# Patient Record
Sex: Male | Born: 1965 | Race: Black or African American | Hispanic: No | Marital: Single | Smoking: Never smoker
Health system: Southern US, Community
[De-identification: ages and names within clinical notes are randomized; demographics above are authoritative.]

## PROBLEM LIST (undated history)

## (undated) DIAGNOSIS — J329 Chronic sinusitis, unspecified: Secondary | ICD-10-CM

## (undated) DIAGNOSIS — T148XXA Other injury of unspecified body region, initial encounter: Secondary | ICD-10-CM

---

## 1997-07-07 ENCOUNTER — Emergency Department (HOSPITAL_COMMUNITY): Admission: EM | Admit: 1997-07-07 | Discharge: 1997-07-07 | Payer: Self-pay | Admitting: Emergency Medicine

## 1998-03-13 ENCOUNTER — Emergency Department (HOSPITAL_COMMUNITY): Admission: EM | Admit: 1998-03-13 | Discharge: 1998-03-13 | Payer: Self-pay | Admitting: Emergency Medicine

## 1998-05-17 ENCOUNTER — Emergency Department (HOSPITAL_COMMUNITY): Admission: EM | Admit: 1998-05-17 | Discharge: 1998-05-17 | Payer: Self-pay | Admitting: *Deleted

## 1998-09-04 ENCOUNTER — Emergency Department (HOSPITAL_COMMUNITY): Admission: EM | Admit: 1998-09-04 | Discharge: 1998-09-04 | Payer: Self-pay | Admitting: Emergency Medicine

## 1998-12-10 ENCOUNTER — Emergency Department (HOSPITAL_COMMUNITY): Admission: EM | Admit: 1998-12-10 | Discharge: 1998-12-10 | Payer: Self-pay | Admitting: Emergency Medicine

## 1999-06-12 ENCOUNTER — Encounter: Payer: Self-pay | Admitting: Emergency Medicine

## 1999-06-12 ENCOUNTER — Emergency Department (HOSPITAL_COMMUNITY): Admission: EM | Admit: 1999-06-12 | Discharge: 1999-06-12 | Payer: Self-pay | Admitting: Emergency Medicine

## 1999-07-03 ENCOUNTER — Encounter: Admission: RE | Admit: 1999-07-03 | Discharge: 1999-08-13 | Payer: Self-pay | Admitting: *Deleted

## 2000-03-20 ENCOUNTER — Encounter: Payer: Self-pay | Admitting: Emergency Medicine

## 2000-03-20 ENCOUNTER — Emergency Department (HOSPITAL_COMMUNITY): Admission: EM | Admit: 2000-03-20 | Discharge: 2000-03-20 | Payer: Self-pay | Admitting: Emergency Medicine

## 2000-12-17 ENCOUNTER — Encounter: Payer: Self-pay | Admitting: Emergency Medicine

## 2000-12-17 ENCOUNTER — Emergency Department (HOSPITAL_COMMUNITY): Admission: EM | Admit: 2000-12-17 | Discharge: 2000-12-17 | Payer: Self-pay | Admitting: Emergency Medicine

## 2001-03-08 ENCOUNTER — Emergency Department (HOSPITAL_COMMUNITY): Admission: EM | Admit: 2001-03-08 | Discharge: 2001-03-08 | Payer: Self-pay

## 2001-08-20 ENCOUNTER — Emergency Department (HOSPITAL_COMMUNITY): Admission: EM | Admit: 2001-08-20 | Discharge: 2001-08-20 | Payer: Self-pay | Admitting: Emergency Medicine

## 2005-02-01 ENCOUNTER — Emergency Department (HOSPITAL_COMMUNITY): Admission: EM | Admit: 2005-02-01 | Discharge: 2005-02-01 | Payer: Self-pay | Admitting: Emergency Medicine

## 2005-06-26 ENCOUNTER — Emergency Department (HOSPITAL_COMMUNITY): Admission: EM | Admit: 2005-06-26 | Discharge: 2005-06-26 | Payer: Self-pay | Admitting: Emergency Medicine

## 2005-06-30 ENCOUNTER — Emergency Department (HOSPITAL_COMMUNITY): Admission: EM | Admit: 2005-06-30 | Discharge: 2005-06-30 | Payer: Self-pay | Admitting: Emergency Medicine

## 2005-07-02 ENCOUNTER — Emergency Department (HOSPITAL_COMMUNITY): Admission: EM | Admit: 2005-07-02 | Discharge: 2005-07-02 | Payer: Self-pay | Admitting: Emergency Medicine

## 2005-07-05 ENCOUNTER — Ambulatory Visit: Payer: Self-pay | Admitting: *Deleted

## 2005-07-05 ENCOUNTER — Ambulatory Visit: Payer: Self-pay | Admitting: Family Medicine

## 2006-04-16 ENCOUNTER — Emergency Department (HOSPITAL_COMMUNITY): Admission: EM | Admit: 2006-04-16 | Discharge: 2006-04-16 | Payer: Self-pay | Admitting: Emergency Medicine

## 2008-05-14 ENCOUNTER — Inpatient Hospital Stay (HOSPITAL_COMMUNITY): Admission: AC | Admit: 2008-05-14 | Discharge: 2008-05-18 | Payer: Self-pay

## 2010-05-09 LAB — CBC
HCT: 40 % (ref 39.0–52.0)
Hemoglobin: 13 g/dL (ref 13.0–17.0)
MCHC: 32.6 g/dL (ref 30.0–36.0)
MCHC: 32.7 g/dL (ref 30.0–36.0)
MCHC: 33.3 g/dL (ref 30.0–36.0)
MCV: 82.9 fL (ref 78.0–100.0)
Platelets: 136 10*3/uL — ABNORMAL LOW (ref 150–400)
RBC: 3.95 MIL/uL — ABNORMAL LOW (ref 4.22–5.81)
RBC: 5.25 MIL/uL (ref 4.22–5.81)
RDW: 14.4 % (ref 11.5–15.5)
RDW: 14.4 % (ref 11.5–15.5)
WBC: 6.4 10*3/uL (ref 4.0–10.5)

## 2010-05-09 LAB — PROTIME-INR: Prothrombin Time: 14.1 seconds (ref 11.6–15.2)

## 2010-05-09 LAB — TYPE AND SCREEN
ABO/RH(D): B POS
Antibody Screen: NEGATIVE

## 2010-05-09 LAB — BASIC METABOLIC PANEL
CO2: 25 mEq/L (ref 19–32)
Calcium: 8 mg/dL — ABNORMAL LOW (ref 8.4–10.5)
Glucose, Bld: 130 mg/dL — ABNORMAL HIGH (ref 70–99)
Sodium: 136 mEq/L (ref 135–145)

## 2010-05-09 LAB — DIFFERENTIAL
Lymphs Abs: 0.8 10*3/uL (ref 0.7–4.0)
Monocytes Relative: 8 % (ref 3–12)
Neutro Abs: 4.9 10*3/uL (ref 1.7–7.7)
Neutrophils Relative %: 77 % (ref 43–77)
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
## 2010-08-11 ENCOUNTER — Emergency Department (HOSPITAL_COMMUNITY)
Admission: EM | Admit: 2010-08-11 | Discharge: 2010-08-11 | Disposition: A | Discharge status: Home / Self Care | Payer: Self-pay | Attending: Emergency Medicine | Admitting: Emergency Medicine

## 2010-08-11 DIAGNOSIS — K029 Dental caries, unspecified: Secondary | ICD-10-CM | POA: Insufficient documentation

## 2010-08-11 DIAGNOSIS — K089 Disorder of teeth and supporting structures, unspecified: Secondary | ICD-10-CM | POA: Insufficient documentation

## 2010-08-11 DIAGNOSIS — F17200 Nicotine dependence, unspecified, uncomplicated: Secondary | ICD-10-CM | POA: Insufficient documentation

## 2012-02-19 ENCOUNTER — Emergency Department (HOSPITAL_COMMUNITY)
Admission: EM | Admit: 2012-02-19 | Discharge: 2012-02-19 | Disposition: A | Discharge status: Home / Self Care | Payer: Self-pay | Attending: Emergency Medicine | Admitting: Emergency Medicine

## 2012-02-19 ENCOUNTER — Emergency Department (HOSPITAL_COMMUNITY): Payer: Self-pay

## 2012-02-19 ENCOUNTER — Encounter (HOSPITAL_COMMUNITY): Payer: Self-pay | Admitting: *Deleted

## 2012-02-19 DIAGNOSIS — J329 Chronic sinusitis, unspecified: Secondary | ICD-10-CM | POA: Insufficient documentation

## 2012-02-19 DIAGNOSIS — R42 Dizziness and giddiness: Secondary | ICD-10-CM | POA: Insufficient documentation

## 2012-02-19 DIAGNOSIS — H81399 Other peripheral vertigo, unspecified ear: Secondary | ICD-10-CM | POA: Insufficient documentation

## 2012-02-19 LAB — POCT I-STAT, CHEM 8
BUN: 14 mg/dL (ref 6–23)
Creatinine, Ser: 1 mg/dL (ref 0.50–1.35)
Glucose, Bld: 95 mg/dL (ref 70–99)
Sodium: 140 mEq/L (ref 135–145)
TCO2: 26 mmol/L (ref 0–100)

## 2012-02-19 MED ORDER — MECLIZINE HCL 25 MG PO TABS: 25.0000 mg | ORAL_TABLET | Freq: Three times a day (TID) | ORAL | Status: DC | PRN

## 2012-02-19 MED ORDER — MECLIZINE HCL 25 MG PO TABS
25.0000 mg | ORAL_TABLET | Freq: Once | ORAL | Status: AC
  Administered 2012-02-19: 25 mg via ORAL
  Filled 2012-02-19: qty 1
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
## 2012-03-01 ENCOUNTER — Encounter (HOSPITAL_COMMUNITY): Payer: Self-pay | Admitting: Emergency Medicine

## 2012-03-01 ENCOUNTER — Emergency Department (HOSPITAL_COMMUNITY)
Admission: EM | Admit: 2012-03-01 | Discharge: 2012-03-01 | Disposition: A | Discharge status: Home / Self Care | Payer: Self-pay | Attending: Emergency Medicine | Admitting: Emergency Medicine

## 2012-03-01 DIAGNOSIS — Z87828 Personal history of other (healed) physical injury and trauma: Secondary | ICD-10-CM | POA: Insufficient documentation

## 2012-03-01 DIAGNOSIS — T148XXA Other injury of unspecified body region, initial encounter: Secondary | ICD-10-CM | POA: Insufficient documentation

## 2012-03-01 DIAGNOSIS — J329 Chronic sinusitis, unspecified: Secondary | ICD-10-CM | POA: Insufficient documentation

## 2012-03-01 DIAGNOSIS — R109 Unspecified abdominal pain: Secondary | ICD-10-CM | POA: Insufficient documentation

## 2012-03-01 DIAGNOSIS — Z8709 Personal history of other diseases of the respiratory system: Secondary | ICD-10-CM | POA: Insufficient documentation

## 2012-03-01 DIAGNOSIS — Z79899 Other long term (current) drug therapy: Secondary | ICD-10-CM | POA: Insufficient documentation

## 2012-03-01 HISTORY — DX: Chronic sinusitis, unspecified: J32.9

## 2012-03-01 HISTORY — DX: Other injury of unspecified body region, initial encounter: T14.8XXA

## 2012-03-01 LAB — COMPREHENSIVE METABOLIC PANEL
BUN: 18 mg/dL (ref 6–23)
Calcium: 9 mg/dL (ref 8.4–10.5)
GFR calc Af Amer: >90 mL/min (ref >90)
Glucose, Bld: 102 mg/dL — ABNORMAL HIGH (ref 70–99)
Total Protein: 6.8 g/dL (ref 6.0–8.3)

## 2012-03-01 LAB — URINALYSIS, ROUTINE W REFLEX MICROSCOPIC
Bilirubin Urine: NEGATIVE
Leukocytes, UA: NEGATIVE
Nitrite: NEGATIVE
Specific Gravity, Urine: 1.021 (ref 1.005–1.030)
Urobilinogen, UA: 1 mg/dL (ref 0.0–1.0)

## 2012-03-01 LAB — CBC WITH DIFFERENTIAL/PLATELET
Basophils Relative: 1 % (ref 0–1)
Eosinophils Absolute: 0.2 10*3/uL (ref 0.0–0.7)
Eosinophils Relative: 4 % (ref 0–5)
Hemoglobin: 13.5 g/dL (ref 13.0–17.0)
Lymphs Abs: 3.1 10*3/uL (ref 0.7–4.0)
MCH: 26.6 pg (ref 26.0–34.0)
MCHC: 32.9 g/dL (ref 30.0–36.0)
MCV: 80.7 fL (ref 78.0–100.0)
Monocytes Relative: 10 % (ref 3–12)
Platelets: 184 10*3/uL (ref 150–400)
RBC: 5.08 MIL/uL (ref 4.22–5.81)

## 2012-03-01 LAB — LIPASE, BLOOD: Lipase: 20 U/L (ref 11–59)

## 2012-03-01 NOTE — ED Notes (Addendum)
Patient complaining of lower abdominal pain/cramping (below umbilicus) for the last week; denies nausea, vomiting, but reports intermittent episodes of diarrhea.  Patient reports changes in urine color, but nothing abnormal.  Denies penile discharge.  History of UTI; reports that this feels different.
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
## 2012-03-27 ENCOUNTER — Encounter (HOSPITAL_COMMUNITY): Payer: Self-pay | Admitting: *Deleted

## 2012-03-27 ENCOUNTER — Emergency Department (HOSPITAL_COMMUNITY)
Admission: EM | Admit: 2012-03-27 | Discharge: 2012-03-27 | Disposition: A | Discharge status: Home / Self Care | Payer: Self-pay | Attending: Emergency Medicine | Admitting: Emergency Medicine

## 2012-03-27 DIAGNOSIS — R369 Urethral discharge, unspecified: Secondary | ICD-10-CM | POA: Insufficient documentation

## 2012-03-27 DIAGNOSIS — N489 Disorder of penis, unspecified: Secondary | ICD-10-CM | POA: Insufficient documentation

## 2012-03-27 DIAGNOSIS — Z8673 Personal history of transient ischemic attack (TIA), and cerebral infarction without residual deficits: Secondary | ICD-10-CM | POA: Insufficient documentation

## 2012-03-27 DIAGNOSIS — E119 Type 2 diabetes mellitus without complications: Secondary | ICD-10-CM | POA: Insufficient documentation

## 2012-03-27 DIAGNOSIS — Z87828 Personal history of other (healed) physical injury and trauma: Secondary | ICD-10-CM | POA: Insufficient documentation

## 2012-03-27 DIAGNOSIS — R300 Dysuria: Secondary | ICD-10-CM | POA: Insufficient documentation

## 2012-03-27 DIAGNOSIS — Z8709 Personal history of other diseases of the respiratory system: Secondary | ICD-10-CM | POA: Insufficient documentation

## 2012-03-27 LAB — URINALYSIS, ROUTINE W REFLEX MICROSCOPIC
Hgb urine dipstick: NEGATIVE
Protein, ur: NEGATIVE mg/dL
Urobilinogen, UA: 0.2 mg/dL (ref 0.0–1.0)

## 2012-03-27 LAB — URINE MICROSCOPIC-ADD ON

## 2012-03-27 MED ORDER — CEFTRIAXONE SODIUM 250 MG IJ SOLR
250.0000 mg | Freq: Once | INTRAMUSCULAR | Status: AC
  Administered 2012-03-27: 250 mg via INTRAMUSCULAR
  Filled 2012-03-27: qty 250

## 2012-03-27 MED ORDER — LIDOCAINE HCL (PF) 1 % IJ SOLN
INTRAMUSCULAR | Status: AC
  Administered 2012-03-27: 2.1 mL
  Filled 2012-03-27: qty 5

## 2012-03-27 MED ORDER — AZITHROMYCIN 250 MG PO TABS
1000.0000 mg | ORAL_TABLET | Freq: Once | ORAL | Status: AC
  Administered 2012-03-27: 1000 mg via ORAL
  Filled 2012-03-27: qty 4

## 2012-03-27 NOTE — ED Notes (Signed)
PT. REPORTS PENILE DISCHARGE AND CONCENTRATED URINE WITH ITCHING AT TIP OF PENIS FOR SEVERAL DAYS.
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
## 2012-03-29 NOTE — ED Provider Notes (Signed)
Medical screening examination/treatment/procedure(s) were performed by non-physician practitioner and as supervising physician I was immediately available for consultation/collaboration.    Arzella Rehmann L Daeja Helderman, MD 03/29/12 1510 

## 2012-03-30 LAB — GC/CHLAMYDIA PROBE AMP: CT Probe RNA: NEGATIVE

## 2012-10-16 ENCOUNTER — Emergency Department (HOSPITAL_COMMUNITY)
Admission: EM | Admit: 2012-10-16 | Discharge: 2012-10-16 | Disposition: A | Discharge status: Home / Self Care | Payer: Self-pay | Attending: Emergency Medicine | Admitting: Emergency Medicine

## 2012-10-16 ENCOUNTER — Encounter (HOSPITAL_COMMUNITY): Payer: Self-pay

## 2012-10-16 DIAGNOSIS — Z8709 Personal history of other diseases of the respiratory system: Secondary | ICD-10-CM | POA: Insufficient documentation

## 2012-10-16 DIAGNOSIS — L255 Unspecified contact dermatitis due to plants, except food: Secondary | ICD-10-CM | POA: Insufficient documentation

## 2012-10-16 DIAGNOSIS — L237 Allergic contact dermatitis due to plants, except food: Secondary | ICD-10-CM

## 2012-10-16 DIAGNOSIS — E119 Type 2 diabetes mellitus without complications: Secondary | ICD-10-CM | POA: Insufficient documentation

## 2012-10-16 DIAGNOSIS — Z8673 Personal history of transient ischemic attack (TIA), and cerebral infarction without residual deficits: Secondary | ICD-10-CM | POA: Insufficient documentation

## 2012-10-16 MED ORDER — DIPHENHYDRAMINE HCL 25 MG PO TABS: 25.0000 mg | ORAL_TABLET | Freq: Four times a day (QID) | ORAL | Status: DC

## 2012-10-16 MED ORDER — PREDNISONE (PAK) 10 MG PO TABS: 10.0000 mg | ORAL_TABLET | Freq: Every day | ORAL | Status: DC

## 2012-10-16 MED ORDER — DIPHENHYDRAMINE HCL 25 MG PO CAPS
50.0000 mg | ORAL_CAPSULE | Freq: Once | ORAL | Status: AC
  Administered 2012-10-16: 50 mg via ORAL
  Filled 2012-10-16: qty 2

## 2012-10-16 MED ORDER — PREDNISONE 20 MG PO TABS
60.0000 mg | ORAL_TABLET | Freq: Once | ORAL | Status: AC
  Administered 2012-10-16: 60 mg via ORAL
  Filled 2012-10-16: qty 3

## 2012-10-16 NOTE — ED Notes (Signed)
Pt reports poison ivy to his posterior Left calf x5 days

## 2012-10-16 NOTE — ED Provider Notes (Signed)
Medical screening examination/treatment/procedure(s) were performed by non-physician practitioner and as supervising physician I was immediately available for consultation/collaboration.  Maleak Brazzel N Devera Englander, DO 10/16/12 1554 
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
## 2013-10-14 ENCOUNTER — Encounter (HOSPITAL_COMMUNITY): Payer: Self-pay | Admitting: Emergency Medicine

## 2013-10-14 ENCOUNTER — Emergency Department (HOSPITAL_COMMUNITY)
Admission: EM | Admit: 2013-10-14 | Discharge: 2013-10-14 | Disposition: A | Discharge status: Home / Self Care | Payer: Self-pay | Attending: Emergency Medicine | Admitting: Emergency Medicine

## 2013-10-14 ENCOUNTER — Emergency Department (HOSPITAL_COMMUNITY): Payer: Self-pay

## 2013-10-14 DIAGNOSIS — E119 Type 2 diabetes mellitus without complications: Secondary | ICD-10-CM | POA: Insufficient documentation

## 2013-10-14 DIAGNOSIS — M79671 Pain in right foot: Secondary | ICD-10-CM

## 2013-10-14 DIAGNOSIS — Z8709 Personal history of other diseases of the respiratory system: Secondary | ICD-10-CM | POA: Insufficient documentation

## 2013-10-14 DIAGNOSIS — Z87828 Personal history of other (healed) physical injury and trauma: Secondary | ICD-10-CM | POA: Insufficient documentation

## 2013-10-14 DIAGNOSIS — G8911 Acute pain due to trauma: Secondary | ICD-10-CM | POA: Insufficient documentation

## 2013-10-14 DIAGNOSIS — R209 Unspecified disturbances of skin sensation: Secondary | ICD-10-CM | POA: Insufficient documentation

## 2013-10-14 DIAGNOSIS — M79609 Pain in unspecified limb: Secondary | ICD-10-CM | POA: Insufficient documentation

## 2013-10-14 DIAGNOSIS — R202 Paresthesia of skin: Secondary | ICD-10-CM

## 2013-10-14 DIAGNOSIS — Z8673 Personal history of transient ischemic attack (TIA), and cerebral infarction without residual deficits: Secondary | ICD-10-CM | POA: Insufficient documentation

## 2013-10-14 DIAGNOSIS — Z79899 Other long term (current) drug therapy: Secondary | ICD-10-CM | POA: Insufficient documentation

## 2013-10-14 LAB — CBG MONITORING, ED: Glucose-Capillary: 94 mg/dL (ref 70–99)

## 2013-10-14 NOTE — Discharge Instructions (Signed)
Musculoskeletal Pain Musculoskeletal pain is muscle and boney aches and pains. These pains can occur in any part of the body. Your caregiver may treat you without knowing the cause of the pain. They may treat you if blood or urine tests, X-rays, and other tests were normal.  CAUSES There is often not a definite cause or reason for these pains. These pains may be caused by a type of germ (virus). The discomfort may also come from overuse. Overuse includes working out too hard when your body is not fit. Boney aches also come from weather changes. Bone is sensitive to atmospheric pressure changes. HOME CARE INSTRUCTIONS   Ask when your test results will be ready. Make sure you get your test results.  Only take over-the-counter or prescription medicines for pain, discomfort, or fever as directed by your caregiver. If you were given medications for your condition, do not drive, operate machinery or power tools, or sign legal documents for 24 hours. Do not drink alcohol. Do not take sleeping pills or other medications that may interfere with treatment.  Continue all activities unless the activities cause more pain. When the pain lessens, slowly resume normal activities. Gradually increase the intensity and duration of the activities or exercise.  During periods of severe pain, bed rest may be helpful. Lay or sit in any position that is comfortable.  Putting ice on the injured area.  Put ice in a bag.  Place a towel between your skin and the bag.  Leave the ice on for 15 to 20 minutes, 3 to 4 times a day.  Follow up with your caregiver for continued problems and no reason can be found for the pain. If the pain becomes worse or does not go away, it may be necessary to repeat tests or do additional testing. Your caregiver may need to look further for a possible cause. SEEK IMMEDIATE MEDICAL CARE IF:  You have pain that is getting worse and is not relieved by medications.  You develop chest pain  that is associated with shortness or breath, sweating, feeling sick to your stomach (nauseous), or throw up (vomit).  Your pain becomes localized to the abdomen.  You develop any new symptoms that seem different or that concern you. MAKE SURE YOU:   Understand these instructions.  Will watch your condition.  Will get help right away if you are not doing well or get worse. Document Released: 01/14/2005 Document Revised: 04/08/2011 Document Reviewed: 09/18/2012 [facility] Patient Information 2015 [place], [place]. This information is not intended to replace advice given to you by your health care provider. Make sure you discuss any questions you have with your health care provider.  Paresthesia Paresthesia is an abnormal burning or prickling sensation. This sensation is generally felt in the hands, arms, legs, or feet. However, it may occur in any part of the body. It is usually not painful. The feeling may be described as:  Tingling or numbness.  "Pins and needles."  Skin crawling.  Buzzing.  Limbs "falling asleep."  Itching. Most people experience temporary (transient) paresthesia at some time in their lives. CAUSES  Paresthesia may occur when you breathe too quickly (hyperventilation). It can also occur without any apparent cause. Commonly, paresthesia occurs when pressure is placed on a nerve. The feeling quickly goes away once the pressure is removed. For some people, however, paresthesia is a long-lasting (chronic) condition caused by an underlying disorder. The underlying disorder may be:  A traumatic, direct injury to nerves. Examples include a:  Broken (fractured) neck.  Fractured skull.  A disorder affecting the brain and spinal cord (central nervous system). Examples include:  Transverse myelitis.  Encephalitis.  Transient ischemic attack.  Multiple sclerosis.  Stroke.  Tumor or blood vessel problems, such as an arteriovenous malformation pressing against the  brain or spinal cord.  A condition that damages the peripheral nerves (peripheral neuropathy). Peripheral nerves are not part of the brain and spinal cord. These conditions include:  Diabetes.  Peripheral vascular disease.  Nerve entrapment syndromes, such as carpal tunnel syndrome.  Shingles.  Hypothyroidism.  Vitamin B12 deficiencies.  Alcoholism.  Heavy metal poisoning (lead, arsenic).  Rheumatoid arthritis.  Systemic lupus erythematosus. DIAGNOSIS  Your caregiver will attempt to find the underlying cause of your paresthesia. Your caregiver may:  Take your medical history.  Perform a physical exam.  Order various lab tests.  Order imaging tests. TREATMENT  Treatment for paresthesia depends on the underlying cause. HOME CARE INSTRUCTIONS  Avoid drinking alcohol.  You may consider massage or acupuncture to help relieve your symptoms.  Keep all follow-up appointments as directed by your caregiver. SEEK IMMEDIATE MEDICAL CARE IF:   You feel weak.  You have trouble walking or moving.  You have problems with speech or vision.  You feel confused.  You cannot control your bladder or bowel movements.  You feel numbness after an injury.  You faint.  Your burning or prickling feeling gets worse when walking.  You have pain, cramps, or dizziness.  You develop a rash. MAKE SURE YOU:  Understand these instructions.  Will watch your condition.  Will get help right away if you are not doing well or get worse. Document Released: 01/04/2002 Document Revised: 04/08/2011 Document Reviewed: 10/05/2010 [facility] Patient Information 2015 [place], [place]. This information is not intended to replace advice given to you by your health care provider. Make sure you discuss any questions you have with your health care provider.

## 2013-10-14 NOTE — ED Notes (Signed)
Pt reports right foot pain x months. States it is progressively worsening with intermittent pins and needles. Pt ambulatory to triage. NAD.
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
## 2013-10-18 NOTE — ED Provider Notes (Signed)
Medical screening examination/treatment/procedure(s) were performed by non-physician practitioner and as supervising physician I was immediately available for consultation/collaboration.   EKG Interpretation None        Raina Sole, MD 10/18/13 2307 

## 2014-08-26 ENCOUNTER — Encounter (HOSPITAL_COMMUNITY): Payer: Self-pay | Admitting: Emergency Medicine

## 2014-08-26 ENCOUNTER — Emergency Department (HOSPITAL_COMMUNITY): Payer: No Typology Code available for payment source

## 2014-08-26 ENCOUNTER — Emergency Department (HOSPITAL_COMMUNITY): Payer: Self-pay

## 2014-08-26 ENCOUNTER — Emergency Department (HOSPITAL_COMMUNITY)
Admission: EM | Admit: 2014-08-26 | Discharge: 2014-08-26 | Disposition: A | Discharge status: Home / Self Care | Payer: Self-pay | Attending: Emergency Medicine | Admitting: Emergency Medicine

## 2014-08-26 DIAGNOSIS — Y9389 Activity, other specified: Secondary | ICD-10-CM | POA: Insufficient documentation

## 2014-08-26 DIAGNOSIS — Y92410 Unspecified street and highway as the place of occurrence of the external cause: Secondary | ICD-10-CM | POA: Insufficient documentation

## 2014-08-26 DIAGNOSIS — M546 Pain in thoracic spine: Secondary | ICD-10-CM

## 2014-08-26 DIAGNOSIS — S299XXA Unspecified injury of thorax, initial encounter: Secondary | ICD-10-CM | POA: Insufficient documentation

## 2014-08-26 DIAGNOSIS — S199XXA Unspecified injury of neck, initial encounter: Secondary | ICD-10-CM | POA: Insufficient documentation

## 2014-08-26 DIAGNOSIS — Y999 Unspecified external cause status: Secondary | ICD-10-CM | POA: Insufficient documentation

## 2014-08-26 DIAGNOSIS — M542 Cervicalgia: Secondary | ICD-10-CM

## 2014-08-26 MED ORDER — HYDROCODONE-ACETAMINOPHEN 5-325 MG PO TABS
2.0000 | ORAL_TABLET | Freq: Once | ORAL | Status: AC
  Administered 2014-08-26: 2 via ORAL
  Filled 2014-08-26: qty 2

## 2014-08-26 MED ORDER — NAPROXEN 500 MG PO TABS: 500.0000 mg | ORAL_TABLET | Freq: Two times a day (BID) | ORAL | Status: DC

## 2014-08-26 MED ORDER — OXYCODONE-ACETAMINOPHEN 5-325 MG PO TABS: 2.0000 | ORAL_TABLET | Freq: Once | ORAL | Status: DC

## 2014-08-26 MED ORDER — CYCLOBENZAPRINE HCL 10 MG PO TABS
5.0000 mg | ORAL_TABLET | Freq: Once | ORAL | Status: AC
  Administered 2014-08-26: 5 mg via ORAL
  Filled 2014-08-26: qty 1

## 2014-08-26 MED ORDER — HYDROCODONE-ACETAMINOPHEN 5-325 MG PO TABS: 2.0000 | ORAL_TABLET | ORAL | Status: DC | PRN

## 2014-08-26 MED ORDER — METHOCARBAMOL 500 MG PO TABS: 500.0000 mg | ORAL_TABLET | Freq: Two times a day (BID) | ORAL | Status: DC

## 2014-08-26 NOTE — ED Notes (Signed)
Patient transported to CT 
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
## 2014-12-04 ENCOUNTER — Encounter (HOSPITAL_COMMUNITY): Payer: Self-pay | Admitting: *Deleted

## 2014-12-04 ENCOUNTER — Emergency Department (HOSPITAL_COMMUNITY)
Admission: EM | Admit: 2014-12-04 | Discharge: 2014-12-04 | Disposition: A | Discharge status: Home / Self Care | Payer: Self-pay | Attending: Emergency Medicine | Admitting: Emergency Medicine

## 2014-12-04 ENCOUNTER — Emergency Department (HOSPITAL_COMMUNITY): Payer: Self-pay

## 2014-12-04 DIAGNOSIS — Z8709 Personal history of other diseases of the respiratory system: Secondary | ICD-10-CM | POA: Insufficient documentation

## 2014-12-04 DIAGNOSIS — Z87828 Personal history of other (healed) physical injury and trauma: Secondary | ICD-10-CM | POA: Insufficient documentation

## 2014-12-04 DIAGNOSIS — R1084 Generalized abdominal pain: Secondary | ICD-10-CM | POA: Insufficient documentation

## 2014-12-04 LAB — URINALYSIS, ROUTINE W REFLEX MICROSCOPIC
BILIRUBIN URINE: NEGATIVE
GLUCOSE, UA: NEGATIVE mg/dL
HGB URINE DIPSTICK: NEGATIVE
Ketones, ur: NEGATIVE mg/dL
Leukocytes, UA: NEGATIVE
Nitrite: NEGATIVE
Protein, ur: NEGATIVE mg/dL
Specific Gravity, Urine: 1.025 (ref 1.005–1.030)
Urobilinogen, UA: 1 mg/dL (ref 0.0–1.0)
pH: 5.5 (ref 5.0–8.0)

## 2014-12-04 LAB — COMPREHENSIVE METABOLIC PANEL
ALT: 29 U/L (ref 17–63)
ANION GAP: 9 (ref 5–15)
AST: 23 U/L (ref 15–41)
Albumin: 4 g/dL (ref 3.5–5.0)
Alkaline Phosphatase: 83 U/L (ref 38–126)
BILIRUBIN TOTAL: 1.1 mg/dL (ref 0.3–1.2)
BUN: 14 mg/dL (ref 6–20)
CALCIUM: 9.2 mg/dL (ref 8.9–10.3)
CO2: 28 mmol/L (ref 22–32)
CREATININE: 1.06 mg/dL (ref 0.61–1.24)
Chloride: 104 mmol/L (ref 101–111)
GFR calc Af Amer: >60 mL/min (ref >60)
Glucose, Bld: 105 mg/dL — ABNORMAL HIGH (ref 65–99)
Potassium: 3.6 mmol/L (ref 3.5–5.1)
SODIUM: 141 mmol/L (ref 135–145)
TOTAL PROTEIN: 6.6 g/dL (ref 6.5–8.1)

## 2014-12-04 LAB — CBC WITH DIFFERENTIAL/PLATELET
BASOS ABS: 0 10*3/uL (ref 0.0–0.1)
Basophils Relative: 1 %
EOS PCT: 4 %
Eosinophils Absolute: 0.2 10*3/uL (ref 0.0–0.7)
HCT: 42.9 % (ref 39.0–52.0)
HEMOGLOBIN: 14.3 g/dL (ref 13.0–17.0)
LYMPHS PCT: 42 %
Lymphs Abs: 1.7 10*3/uL (ref 0.7–4.0)
MCH: 26.9 pg (ref 26.0–34.0)
MCHC: 33.3 g/dL (ref 30.0–36.0)
MCV: 80.6 fL (ref 78.0–100.0)
Monocytes Absolute: 0.4 10*3/uL (ref 0.1–1.0)
Monocytes Relative: 10 %
NEUTROS ABS: 1.8 10*3/uL (ref 1.7–7.7)
Neutrophils Relative %: 43 %
Platelets: 170 10*3/uL (ref 150–400)
RBC: 5.32 MIL/uL (ref 4.22–5.81)
RDW: 14 % (ref 11.5–15.5)
WBC: 4.1 10*3/uL (ref 4.0–10.5)

## 2014-12-04 LAB — LIPASE, BLOOD: Lipase: 21 U/L (ref 11–51)

## 2014-12-04 MED ORDER — RANITIDINE HCL 150 MG/10ML PO SYRP
300.0000 mg | ORAL_SOLUTION | Freq: Once | ORAL | Status: AC
  Administered 2014-12-04: 300 mg via ORAL
  Filled 2014-12-04: qty 20

## 2014-12-04 MED ORDER — ALUM & MAG HYDROXIDE-SIMETH 200-200-20 MG/5ML PO SUSP
30.0000 mL | Freq: Once | ORAL | Status: AC
  Administered 2014-12-04: 30 mL via ORAL
  Filled 2014-12-04: qty 30

## 2014-12-04 MED ORDER — METOCLOPRAMIDE HCL 10 MG PO TABS
10.0000 mg | ORAL_TABLET | Freq: Once | ORAL | Status: AC
  Administered 2014-12-04: 10 mg via ORAL
  Filled 2014-12-04: qty 1

## 2014-12-04 MED ORDER — RANITIDINE HCL 150 MG PO TABS: 150.0000 mg | ORAL_TABLET | Freq: Two times a day (BID) | ORAL | Status: AC

## 2014-12-04 NOTE — ED Notes (Signed)
The pt is c/o abd pain for one month that sometimes goes  Into his lower chest.  He also has some burning when he urinates he has not seen anyone

## 2014-12-04 NOTE — Discharge Instructions (Signed)
Abdominal Pain, Adult Mr. Chad Fitzpatrick, your blood work and xray today are normal.  Take ranitidine daily to help with your pain.  See a primary care doctor within 3 days for close follow up.  If symptoms worsen, come back to the ED immediately.  Thank you. Many things can cause belly (abdominal) pain. Most times, the belly pain is not dangerous. Many cases of belly pain can be watched and treated at home. HOME CARE   Do not take medicines that help you go poop (laxatives) unless told to by your doctor.  Only take medicine as told by your doctor.  Eat or drink as told by your doctor. Your doctor will tell you if you should be on a special diet. GET HELP IF:  You do not know what is causing your belly pain.  You have belly pain while you are sick to your stomach (nauseous) or have runny poop (diarrhea).  You have pain while you pee or poop.  Your belly pain wakes you up at night.  You have belly pain that gets worse or better when you eat.  You have belly pain that gets worse when you eat fatty foods.  You have a fever. GET HELP RIGHT AWAY IF:   The pain does not go away within 2 hours.  You keep throwing up (vomiting).  The pain changes and is only in the right or left part of the belly.  You have bloody or tarry looking poop. MAKE SURE YOU:   Understand these instructions.  Will watch your condition.  Will get help right away if you are not doing well or get worse.   This information is not intended to replace advice given to you by your health care provider. Make sure you discuss any questions you have with your health care provider.   Document Released: 07/03/2007 Document Revised: 02/04/2014 Document Reviewed: 09/23/2012 Elsevier Interactive Patient Education [employer] Inc.

## 2014-12-04 NOTE — ED Notes (Signed)
Dr. Oni at bedside. 

## 2014-12-04 NOTE — ED Notes (Signed)
Pt. Left with all belongings and refused wheelchair. Discharge instructions were reviewed and all questions were answered.  
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
## 2015-04-23 ENCOUNTER — Emergency Department (HOSPITAL_COMMUNITY)
Admission: EM | Admit: 2015-04-23 | Discharge: 2015-04-23 | Disposition: A | Discharge status: Home / Self Care | Payer: No Typology Code available for payment source | Attending: Emergency Medicine | Admitting: Emergency Medicine

## 2015-04-23 ENCOUNTER — Encounter (HOSPITAL_COMMUNITY): Payer: Self-pay | Admitting: Emergency Medicine

## 2015-04-23 DIAGNOSIS — J111 Influenza due to unidentified influenza virus with other respiratory manifestations: Secondary | ICD-10-CM | POA: Insufficient documentation

## 2015-04-23 DIAGNOSIS — Z87828 Personal history of other (healed) physical injury and trauma: Secondary | ICD-10-CM | POA: Insufficient documentation

## 2015-04-23 DIAGNOSIS — R69 Illness, unspecified: Secondary | ICD-10-CM

## 2015-04-23 DIAGNOSIS — Z79899 Other long term (current) drug therapy: Secondary | ICD-10-CM | POA: Insufficient documentation

## 2015-04-23 MED ORDER — ONDANSETRON 4 MG PO TBDP: 4.0000 mg | ORAL_TABLET | Freq: Three times a day (TID) | ORAL | Status: AC | PRN

## 2015-04-23 MED ORDER — FLUTICASONE PROPIONATE 50 MCG/ACT NA SUSP: 2.0000 | Freq: Every day | NASAL | Status: AC

## 2015-04-23 MED ORDER — CETIRIZINE HCL 10 MG PO TABS: 10.0000 mg | ORAL_TABLET | Freq: Every day | ORAL | Status: AC

## 2015-04-23 MED ORDER — NAPROXEN 250 MG PO TABS: 250.0000 mg | ORAL_TABLET | Freq: Two times a day (BID) | ORAL | Status: AC

## 2015-04-23 NOTE — ED Notes (Signed)
PA at bedside.

## 2015-04-23 NOTE — Discharge Instructions (Signed)
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
## 2015-04-23 NOTE — ED Notes (Signed)
Pt reports nausea,  cough, congestion, and HA since Friday. Denies fever. States he has taken pepto bismol, zyrtec and vit B12

## 2015-08-30 ENCOUNTER — Emergency Department (HOSPITAL_COMMUNITY)
Admission: EM | Admit: 2015-08-30 | Discharge: 2015-08-30 | Disposition: A | Discharge status: Home / Self Care | Payer: Self-pay | Attending: Emergency Medicine | Admitting: Emergency Medicine

## 2015-08-30 ENCOUNTER — Encounter (HOSPITAL_COMMUNITY): Payer: Self-pay | Admitting: Emergency Medicine

## 2015-08-30 DIAGNOSIS — K029 Dental caries, unspecified: Secondary | ICD-10-CM | POA: Insufficient documentation

## 2015-08-30 DIAGNOSIS — K047 Periapical abscess without sinus: Secondary | ICD-10-CM | POA: Insufficient documentation

## 2015-08-30 MED ORDER — TRAMADOL HCL 50 MG PO TABS
50.0000 mg | ORAL_TABLET | Freq: Once | ORAL | Status: AC
  Administered 2015-08-30: 50 mg via ORAL
  Filled 2015-08-30: qty 1

## 2015-08-30 MED ORDER — TRAMADOL HCL 50 MG PO TABS: 50.0000 mg | ORAL_TABLET | Freq: Four times a day (QID) | ORAL | 0 refills | Status: AC | PRN

## 2015-08-30 MED ORDER — PENICILLIN V POTASSIUM 250 MG PO TABS
500.0000 mg | ORAL_TABLET | ORAL | Status: AC
  Administered 2015-08-30: 500 mg via ORAL
  Filled 2015-08-30: qty 2

## 2015-08-30 MED ORDER — PENICILLIN V POTASSIUM 500 MG PO TABS: 500.0000 mg | ORAL_TABLET | Freq: Three times a day (TID) | ORAL | 0 refills | Status: AC
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
## 2015-08-30 NOTE — Discharge Instructions (Signed)
Get dental wax or temporary filling from the pharmacy and put a small amount into the cavity  make an appointment with your dentist ASAP

## 2015-08-30 NOTE — ED Triage Notes (Signed)
Pt. reports right lower molar pain onset last week .

## 2015-09-16 ENCOUNTER — Encounter (HOSPITAL_COMMUNITY): Payer: Self-pay | Admitting: Emergency Medicine

## 2015-09-16 ENCOUNTER — Emergency Department (HOSPITAL_COMMUNITY)
Admission: EM | Admit: 2015-09-16 | Discharge: 2015-09-16 | Disposition: A | Discharge status: Home / Self Care | Payer: Self-pay | Attending: Emergency Medicine | Admitting: Emergency Medicine

## 2015-09-16 DIAGNOSIS — Z792 Long term (current) use of antibiotics: Secondary | ICD-10-CM | POA: Insufficient documentation

## 2015-09-16 DIAGNOSIS — Z7951 Long term (current) use of inhaled steroids: Secondary | ICD-10-CM | POA: Insufficient documentation

## 2015-09-16 DIAGNOSIS — K029 Dental caries, unspecified: Secondary | ICD-10-CM | POA: Insufficient documentation

## 2015-09-16 DIAGNOSIS — Z79899 Other long term (current) drug therapy: Secondary | ICD-10-CM | POA: Insufficient documentation

## 2015-09-16 DIAGNOSIS — Z791 Long term (current) use of non-steroidal anti-inflammatories (NSAID): Secondary | ICD-10-CM | POA: Insufficient documentation

## 2015-09-16 MED ORDER — AMOXICILLIN 500 MG PO CAPS: 500.0000 mg | ORAL_CAPSULE | Freq: Three times a day (TID) | ORAL | 0 refills | Status: AC

## 2015-09-16 MED ORDER — IBUPROFEN 800 MG PO TABS
800.0000 mg | ORAL_TABLET | Freq: Once | ORAL | Status: AC
Start: 2015-09-16 — End: 2015-09-16
  Administered 2015-09-16: 800 mg via ORAL
  Filled 2015-09-16: qty 1

## 2015-09-16 MED ORDER — IBUPROFEN 800 MG PO TABS: 800.0000 mg | ORAL_TABLET | Freq: Three times a day (TID) | ORAL | 0 refills | Status: AC
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
## 2015-09-16 NOTE — Discharge Instructions (Signed)
Follow up with your dentist as soon as possible. Alternatively, try calling the dental clinics listed on the attached document.

## 2015-09-24 ENCOUNTER — Emergency Department (HOSPITAL_COMMUNITY)
Admission: EM | Admit: 2015-09-24 | Discharge: 2015-09-24 | Disposition: A | Discharge status: Home / Self Care | Payer: Self-pay | Attending: Emergency Medicine | Admitting: Emergency Medicine

## 2015-09-24 ENCOUNTER — Encounter (HOSPITAL_COMMUNITY): Payer: Self-pay | Admitting: *Deleted

## 2015-09-24 DIAGNOSIS — Z79899 Other long term (current) drug therapy: Secondary | ICD-10-CM | POA: Insufficient documentation

## 2015-09-24 DIAGNOSIS — K047 Periapical abscess without sinus: Secondary | ICD-10-CM | POA: Insufficient documentation

## 2015-09-24 MED ORDER — ACETAMINOPHEN 500 MG PO TABS
1000.0000 mg | ORAL_TABLET | Freq: Once | ORAL | Status: AC
  Administered 2015-09-24: 1000 mg via ORAL
  Filled 2015-09-24: qty 2

## 2015-09-24 MED ORDER — BUPIVACAINE-EPINEPHRINE (PF) 0.5% -1:200000 IJ SOLN
1.8000 mL | Freq: Once | INTRAMUSCULAR | Status: AC
  Administered 2015-09-24: 1.8 mL
  Filled 2015-09-24 (×2): qty 1.8

## 2015-09-24 MED ORDER — IBUPROFEN 800 MG PO TABS
800.0000 mg | ORAL_TABLET | Freq: Once | ORAL | Status: AC
  Administered 2015-09-24: 800 mg via ORAL
  Filled 2015-09-24: qty 2

## 2015-09-24 MED ORDER — CLINDAMYCIN HCL 150 MG PO CAPS: 450.0000 mg | ORAL_CAPSULE | Freq: Four times a day (QID) | ORAL | 0 refills | Status: AC

## 2015-09-24 MED ORDER — OXYCODONE HCL 5 MG PO TABS
5.0000 mg | ORAL_TABLET | Freq: Once | ORAL | Status: AC
  Administered 2015-09-24: 5 mg via ORAL
  Filled 2015-09-24: qty 1
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
## 2015-09-24 NOTE — ED Triage Notes (Signed)
Pt c/o L lower molar pain x 2 days and swelling to jaw

## 2015-09-24 NOTE — Discharge Instructions (Signed)
Take 4 over the counter ibuprofen tablets 3 times a day or 2 over-the-counter naproxen tablets twice a day for pain. Also take tylenol 1000mg(2 extra strength) four times a day.    
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
